# Patient Record
Sex: Female | Born: 1965 | Race: White | Hispanic: No | Marital: Single | Smoking: Never smoker
Health system: Southern US, Community
[De-identification: ages and names within clinical notes are randomized; demographics above are authoritative.]

## PROBLEM LIST (undated history)

## (undated) DIAGNOSIS — E876 Hypokalemia: Secondary | ICD-10-CM

## (undated) DIAGNOSIS — I10 Essential (primary) hypertension: Secondary | ICD-10-CM

## (undated) DIAGNOSIS — K219 Gastro-esophageal reflux disease without esophagitis: Secondary | ICD-10-CM

## (undated) DIAGNOSIS — F32A Depression, unspecified: Secondary | ICD-10-CM

## (undated) DIAGNOSIS — F329 Major depressive disorder, single episode, unspecified: Secondary | ICD-10-CM

## (undated) DIAGNOSIS — M329 Systemic lupus erythematosus, unspecified: Secondary | ICD-10-CM

## (undated) HISTORY — PX: ABDOMINAL HYSTERECTOMY: SHX81

---

## 2014-09-30 ENCOUNTER — Emergency Department (HOSPITAL_BASED_OUTPATIENT_CLINIC_OR_DEPARTMENT_OTHER)
Admission: EM | Admit: 2014-09-30 | Discharge: 2014-10-01 | Disposition: A | Discharge status: Home / Self Care | Payer: Self-pay | Attending: Emergency Medicine | Admitting: Emergency Medicine

## 2014-09-30 ENCOUNTER — Encounter (HOSPITAL_BASED_OUTPATIENT_CLINIC_OR_DEPARTMENT_OTHER): Payer: Self-pay | Admitting: Emergency Medicine

## 2014-09-30 ENCOUNTER — Emergency Department (HOSPITAL_BASED_OUTPATIENT_CLINIC_OR_DEPARTMENT_OTHER): Payer: Self-pay

## 2014-09-30 DIAGNOSIS — J392 Other diseases of pharynx: Secondary | ICD-10-CM | POA: Insufficient documentation

## 2014-09-30 DIAGNOSIS — Z8659 Personal history of other mental and behavioral disorders: Secondary | ICD-10-CM | POA: Insufficient documentation

## 2014-09-30 DIAGNOSIS — R070 Pain in throat: Secondary | ICD-10-CM

## 2014-09-30 DIAGNOSIS — R4702 Dysphasia: Secondary | ICD-10-CM | POA: Insufficient documentation

## 2014-09-30 DIAGNOSIS — I10 Essential (primary) hypertension: Secondary | ICD-10-CM | POA: Insufficient documentation

## 2014-09-30 DIAGNOSIS — R499 Unspecified voice and resonance disorder: Secondary | ICD-10-CM | POA: Insufficient documentation

## 2014-09-30 DIAGNOSIS — K219 Gastro-esophageal reflux disease without esophagitis: Secondary | ICD-10-CM | POA: Insufficient documentation

## 2014-09-30 HISTORY — DX: Gastro-esophageal reflux disease without esophagitis: K21.9

## 2014-09-30 HISTORY — DX: Depression, unspecified: F32.A

## 2014-09-30 HISTORY — DX: Major depressive disorder, single episode, unspecified: F32.9

## 2014-09-30 HISTORY — DX: Essential (primary) hypertension: I10

## 2014-09-30 LAB — BASIC METABOLIC PANEL
ANION GAP: 8 (ref 5–15)
BUN: 16 mg/dL (ref 6–23)
CALCIUM: 9.5 mg/dL (ref 8.4–10.5)
CO2: 29 mmol/L (ref 19–32)
Chloride: 102 mmol/L (ref 96–112)
Creatinine, Ser: 0.7 mg/dL (ref 0.50–1.10)
GFR calc non Af Amer: >90 mL/min (ref >90)
GLUCOSE: 109 mg/dL — AB (ref 70–99)
POTASSIUM: 3.3 mmol/L — AB (ref 3.5–5.1)
SODIUM: 139 mmol/L (ref 135–145)

## 2014-09-30 LAB — CBC WITH DIFFERENTIAL/PLATELET
BASOS PCT: 0 % (ref 0–1)
Basophils Absolute: 0 10*3/uL (ref 0.0–0.1)
EOS PCT: 2 % (ref 0–5)
Eosinophils Absolute: 0.3 10*3/uL (ref 0.0–0.7)
HEMATOCRIT: 39.8 % (ref 36.0–46.0)
HEMOGLOBIN: 13.4 g/dL (ref 12.0–15.0)
LYMPHS ABS: 5.2 10*3/uL — AB (ref 0.7–4.0)
LYMPHS PCT: 33 % (ref 12–46)
MCH: 29.7 pg (ref 26.0–34.0)
MCHC: 33.7 g/dL (ref 30.0–36.0)
MCV: 88.2 fL (ref 78.0–100.0)
MONO ABS: 1.6 10*3/uL — AB (ref 0.1–1.0)
Monocytes Relative: 10 % (ref 3–12)
NEUTROS ABS: 8.7 10*3/uL — AB (ref 1.7–7.7)
Neutrophils Relative %: 55 % (ref 43–77)
Platelets: 346 10*3/uL (ref 150–400)
RBC: 4.51 MIL/uL (ref 3.87–5.11)
RDW: 13.2 % (ref 11.5–15.5)
WBC: 15.8 10*3/uL — AB (ref 4.0–10.5)

## 2014-09-30 MED ORDER — DIPHENHYDRAMINE HCL 25 MG PO CAPS
50.0000 mg | ORAL_CAPSULE | Freq: Once | ORAL | Status: AC
  Administered 2014-09-30: 50 mg via ORAL
  Filled 2014-09-30: qty 2

## 2014-09-30 NOTE — ED Notes (Signed)
Per pt feels like swelling or something in throat woke w it last sat,  Has been seen for same by pcp  But no improvement

## 2014-09-30 NOTE — ED Notes (Signed)
Patient states that in the middle of the week she felt like her throat was swelling up and she went to her doctor. She was placed on prednisone, and then sent home and told to stop her flexiril. Patient states that she still feel like her throat is closing up and "I have to create my own saliva"

## 2014-09-30 NOTE — ED Provider Notes (Signed)
CSN: [id number]     Arrival date & time 09/30/14  2149 History  This chart was scribed for Pricilla Loveless, MD by Evon Slack, ED Scribe. This patient was seen in room MH01/MH01 and the patient's care was started at 10:32 PM.      Chief Complaint  Patient presents with  . Oral Swelling   HPI HPI Comments: Brenda Kramer is a 49 y.o. female who presents to the Emergency Department complaining of oral swelling onset 5 days prior. Pt states she feels as if she has something in her throat that she cant get up. Pt states she has associated voice change that is improving. Pt states she feels as if she is having difficulty swallowing (due to discomfort). Pt states that she feels as of she has to swallow to create her own saliva. Pt denies pain but states that it is annoying. Pt states she noticed her symptoms after being prescribed flexeril for back spasms. Pt states she went to her PCP 3 days ago and was started on prednisone (taper) and told to stop taking the flexeril and her symptoms have not improved. Pt denies fever, sore throat, vomiting or rash.  Past Medical History  Diagnosis Date  . Hypertension   . Depression   . GERD (gastroesophageal reflux disease)    Past Surgical History  Procedure Laterality Date  . Abdominal hysterectomy     No family history on file. History  Substance Use Topics  . Smoking status: Never Smoker   . Smokeless tobacco: Not on file  . Alcohol Use: No   OB History    No data available      Review of Systems  Constitutional: Negative for fever and chills.  HENT: Positive for trouble swallowing (painful, but able to tolerate PO) and voice change. Negative for sore throat.   Respiratory: Negative for shortness of breath.   Gastrointestinal: Negative for vomiting.  Musculoskeletal: Negative for neck pain and neck stiffness.  Skin: Negative for rash.  All other systems reviewed and are negative.    Allergies  Review of patient's allergies indicates  no known allergies.  Home Medications   Prior to Admission medications   Medication Sig Start Date End Date Taking? Authorizing Provider  esomeprazole (NEXIUM) 40 MG capsule Take 40 mg by mouth daily at 12 noon.   Yes Historical Provider, MD   BP 171/106 mmHg  Pulse 117  Temp(Src) 98.5 F (36.9 C) (Oral)  Resp 20  Ht  (1.676 m)  Wt 200 lb (90.719 kg)  BMI 32.30 kg/m2  SpO2 96%   Physical Exam  Constitutional: She is oriented to person, place, and time. She appears well-developed and well-nourished. No distress.  HENT:  Head: Normocephalic and atraumatic.  Right Ear: External ear normal.  Left Ear: External ear normal.  Mouth/Throat: Uvula is midline. No trismus in the jaw. No uvula swelling. No oropharyngeal exudate, posterior oropharyngeal edema, posterior oropharyngeal erythema or tonsillar abscesses.  Eyes: Right eye exhibits no discharge. Left eye exhibits no discharge.  Neck: Normal range of motion. Neck supple. No rigidity. No tracheal deviation present.  Cardiovascular: Regular rhythm and normal heart sounds.  Tachycardia present.   Pulmonary/Chest: Effort normal. No stridor. No respiratory distress. She has no wheezes.  Abdominal: She exhibits no distension.  Musculoskeletal: Normal range of motion.  Neurological: She is alert and oriented to person, place, and time.  Skin: Skin is warm and dry. She is not diaphoretic.  Psychiatric: She has a normal mood and  affect. Her behavior is normal.  Nursing note and vitals reviewed.   ED Course  Procedures (including critical care time) DIAGNOSTIC STUDIES: Oxygen Saturation is 96% on RA, adequate by my interpretation.    COORDINATION OF CARE: 10:41 PM-Discussed treatment plan with pt at bedside and pt agreed to plan.     Labs Review Labs Reviewed  BASIC METABOLIC PANEL - Abnormal; Notable for the following:    Potassium 3.3 (*)    Glucose, Bld 109 (*)    All other components within normal limits  CBC WITH  DIFFERENTIAL/PLATELET - Abnormal; Notable for the following:    WBC 15.8 (*)    Neutro Abs 8.7 (*)    Lymphs Abs 5.2 (*)    Monocytes Absolute 1.6 (*)    All other components within normal limits    Imaging Review Dg Neck Soft Tissue  09/30/2014   CLINICAL DATA:  Difficulty swallowing. Neck pain, hoarseness. Question foreign body.  EXAM: NECK SOFT TISSUES - 1+ VIEW  COMPARISON:  None.  FINDINGS: There is no evidence of retropharyngeal soft tissue swelling or epiglottic enlargement. The cervical airway is unremarkable and no radio-opaque foreign body identified.  IMPRESSION: Negative.   Electronically Signed   By: Charlett NoseKevin  Dover M.D.   On: 09/30/2014 23:33     EKG Interpretation None      MDM   Final diagnoses:  Throat discomfort   Patient appears well here, her initial heart rate seemed to be mostly anxiety that has improved. Patient has a soft neck with no rigidity. No drooling, fever, or other symptoms to be concerned about deep space infection. Soft tissue x-ray is unremarkable. She is able to tolerate fluids here in the ER. This could be possibly allergic reaction but has been going on from almost 1 week. She is on a blood pressure medicine but she's not know what it is. I discussed with her that if his lisinopril she should stop and ask her PCP to change her as this could be related. I do not know what is causing her irritation but there appears to be no acute infection or airway emergency. Stable for discharge home.   I personally performed the services described in this documentation, which was scribed in my presence. The recorded information has been reviewed and is accurate.      Pricilla LovelessScott Saoirse Legere, MD 10/01/14 0002

## 2014-09-30 NOTE — Discharge Instructions (Signed)
Check your blood pressure medicine when he get home. If you are on a medicine called LISINOPRIL, stop it immediately and call your Doctor to get your medicine switched.

## 2014-11-04 ENCOUNTER — Emergency Department (HOSPITAL_BASED_OUTPATIENT_CLINIC_OR_DEPARTMENT_OTHER)
Admission: EM | Admit: 2014-11-04 | Discharge: 2014-11-04 | Discharge status: Against Medical Advice | Payer: Self-pay | Attending: Emergency Medicine | Admitting: Emergency Medicine

## 2014-11-04 ENCOUNTER — Emergency Department (HOSPITAL_BASED_OUTPATIENT_CLINIC_OR_DEPARTMENT_OTHER): Payer: Self-pay

## 2014-11-04 ENCOUNTER — Encounter (HOSPITAL_BASED_OUTPATIENT_CLINIC_OR_DEPARTMENT_OTHER): Payer: Self-pay

## 2014-11-04 DIAGNOSIS — M791 Myalgia, unspecified site: Secondary | ICD-10-CM

## 2014-11-04 DIAGNOSIS — K219 Gastro-esophageal reflux disease without esophagitis: Secondary | ICD-10-CM | POA: Insufficient documentation

## 2014-11-04 DIAGNOSIS — E876 Hypokalemia: Secondary | ICD-10-CM | POA: Insufficient documentation

## 2014-11-04 DIAGNOSIS — Z79899 Other long term (current) drug therapy: Secondary | ICD-10-CM | POA: Insufficient documentation

## 2014-11-04 DIAGNOSIS — I10 Essential (primary) hypertension: Secondary | ICD-10-CM | POA: Insufficient documentation

## 2014-11-04 DIAGNOSIS — Z8659 Personal history of other mental and behavioral disorders: Secondary | ICD-10-CM | POA: Insufficient documentation

## 2014-11-04 HISTORY — DX: Hypokalemia: E87.6

## 2014-11-04 LAB — CK: Total CK: 87 U/L (ref 7–177)

## 2014-11-04 LAB — COMPREHENSIVE METABOLIC PANEL
ALT: 28 U/L (ref 0–35)
AST: 25 U/L (ref 0–37)
Albumin: 4.3 g/dL (ref 3.5–5.2)
Alkaline Phosphatase: 105 U/L (ref 39–117)
Anion gap: 5 (ref 5–15)
BUN: 9 mg/dL (ref 6–23)
CHLORIDE: 102 mmol/L (ref 96–112)
CO2: 28 mmol/L (ref 19–32)
Calcium: 9.5 mg/dL (ref 8.4–10.5)
Creatinine, Ser: 0.71 mg/dL (ref 0.50–1.10)
GFR calc non Af Amer: >90 mL/min (ref >90)
Glucose, Bld: 112 mg/dL — ABNORMAL HIGH (ref 70–99)
Potassium: 3.2 mmol/L — ABNORMAL LOW (ref 3.5–5.1)
Sodium: 135 mmol/L (ref 135–145)
TOTAL PROTEIN: 7.4 g/dL (ref 6.0–8.3)
Total Bilirubin: 0.5 mg/dL (ref 0.3–1.2)

## 2014-11-04 LAB — CBC WITH DIFFERENTIAL/PLATELET
BASOS ABS: 0 10*3/uL (ref 0.0–0.1)
Basophils Relative: 1 % (ref 0–1)
Eosinophils Absolute: 0.2 10*3/uL (ref 0.0–0.7)
Eosinophils Relative: 2 % (ref 0–5)
HCT: 41.1 % (ref 36.0–46.0)
Hemoglobin: 14.4 g/dL (ref 12.0–15.0)
Lymphocytes Relative: 30 % (ref 12–46)
Lymphs Abs: 2.3 10*3/uL (ref 0.7–4.0)
MCH: 29.9 pg (ref 26.0–34.0)
MCHC: 35 g/dL (ref 30.0–36.0)
MCV: 85.4 fL (ref 78.0–100.0)
MONO ABS: 0.5 10*3/uL (ref 0.1–1.0)
Monocytes Relative: 7 % (ref 3–12)
Neutro Abs: 4.6 10*3/uL (ref 1.7–7.7)
Neutrophils Relative %: 60 % (ref 43–77)
Platelets: 364 10*3/uL (ref 150–400)
RBC: 4.81 MIL/uL (ref 3.87–5.11)
RDW: 12.7 % (ref 11.5–15.5)
WBC: 7.7 10*3/uL (ref 4.0–10.5)

## 2014-11-04 LAB — MAGNESIUM: Magnesium: 2.1 mg/dL (ref 1.5–2.5)

## 2014-11-04 MED ORDER — POTASSIUM CHLORIDE CRYS ER 20 MEQ PO TBCR
40.0000 meq | EXTENDED_RELEASE_TABLET | Freq: Once | ORAL | Status: AC
  Administered 2014-11-04: 40 meq via ORAL
  Filled 2014-11-04: qty 2

## 2014-11-04 MED ORDER — POTASSIUM CHLORIDE 20 MEQ/15ML (10%) PO SOLN
40.0000 meq | Freq: Once | ORAL | Status: DC
  Filled 2014-11-04: qty 30

## 2014-11-04 NOTE — ED Notes (Signed)
In room to update patient, pt not in room, pt belongings not present, called patient, pt states she removed her IV herself and placed in the cannister on the wall (sharps box) - iv saline lock tubing visible in the sharps container - pt states she was tired of waiting. Informed patient that she did not wait for d/c instructions, pt states she was aware of that but did not want to wait. Apologized for her perception of ED delay.

## 2014-11-04 NOTE — ED Provider Notes (Signed)
CSN: [id number]     Arrival date & time 11/04/14  1624 History   First MD Initiated Contact with Patient 11/04/14 1635     Chief Complaint  Patient presents with  . Leg Swelling     (Consider location/radiation/quality/duration/timing/severity/associated sxs/prior Treatment) HPI [name] is a 49 y.o. female with history of hypertension and hypokalemia, presents to emergency department complaining of generalized myalgias, weakness, right leg pain. Patient states her symptoms have been going on for 5 days. She states she has history of the same with diagnosis of rhabdomyolysis. She states that she has bilateral leg swelling as well. She states she has pain in her right calf. Denies any injuries. Denies any strenuous activities. No recent travel or surgeries. No fever. Pt went to see her PCP today, was sent here.   Past Medical History  Diagnosis Date  . Hypertension   . Depression   . GERD (gastroesophageal reflux disease)   . Hypokalemia    Past Surgical History  Procedure Laterality Date  . Abdominal hysterectomy     No family history on file. History  Substance Use Topics  . Smoking status: Never Smoker   . Smokeless tobacco: Not on file  . Alcohol Use: No   OB History    No data available     Review of Systems  Constitutional: Positive for fatigue. Negative for fever and chills.  Respiratory: Negative for cough, chest tightness and shortness of breath.   Cardiovascular: Negative for chest pain, palpitations and leg swelling.  Gastrointestinal: Negative for nausea, vomiting, abdominal pain and diarrhea.  Genitourinary: Negative for dysuria and flank pain.  Musculoskeletal: Positive for myalgias and arthralgias. Negative for neck pain and neck stiffness.  Skin: Negative for rash.  Neurological: Negative for dizziness, weakness, numbness and headaches.  All other systems reviewed and are negative.     Allergies  Flexeril  Home Medications   Prior to Admission  medications   Medication Sig Start Date End Date Taking? Authorizing Provider  esomeprazole (NEXIUM) 40 MG capsule Take 40 mg by mouth daily at 12 noon.    Historical Provider, MD   BP 149/96 mmHg  Pulse 102  Temp(Src) 98.3 F (36.8 C) (Oral)  Resp 20  Ht 5\' 6"  (1.676 m)  Wt 221 lb (100.245 kg)  BMI 35.69 kg/m2  SpO2 100% Physical Exam  Constitutional: She appears well-developed and well-nourished. No distress.  HENT:  Head: Normocephalic.  Eyes: Conjunctivae are normal.  Neck: Neck supple.  Cardiovascular: Normal rate, regular rhythm and normal heart sounds.   Pulmonary/Chest: Effort normal and breath sounds normal. No respiratory distress. She has no wheezes. She has no rales.  Abdominal: Soft. Bowel sounds are normal. She exhibits no distension. There is no tenderness. There is no rebound.  Musculoskeletal: She exhibits no edema.  No obvious edema on my exam. TTP to right calf. Negative homans sign. No erythema, not warm to the touch, no skin rashes over bilateral legs. Moves all extremities without difficulties  Neurological: She is alert.  Skin: Skin is warm and dry.  Psychiatric: She has a normal mood and affect. Her behavior is normal.  Nursing note and vitals reviewed.   ED Course  Procedures (including critical care time) Labs Review Labs Reviewed  COMPREHENSIVE METABOLIC PANEL - Abnormal; Notable for the following:    Potassium 3.2 (*)    Glucose, Bld 112 (*)    All other components within normal limits  CBC WITH DIFFERENTIAL/PLATELET  MAGNESIUM  CK  Imaging Review No results found.   EKG Interpretation None      MDM   Final diagnoses:  Myalgia  Hypokalemia    patient with generalized muscle aches, subjective lower leg swelling bilaterally, right calf and right leg pain. History of rhabdomyolysis, will get labs including CK level. Will get ultrasound of the right leg to rule out DVT. She is afebrile, doubt infection at this time.  Labs all  negative except for slightly low potassium of 3.2. 40 mEq given by mouth in the emergency department. Ultrasound pending.  Patient walked out [facility] without informing staff after her ultrasound was done but not yet read.  Jaynie Crumble, PA-C 11/04/14 2220  Arby Barrette, MD 11/05/14 [phone]

## 2014-11-04 NOTE — ED Notes (Signed)
Patient transported to Ultrasound 

## 2014-11-04 NOTE — ED Notes (Addendum)
Pt reports having bodyaches and leg swelling x 5 days.  Reports saw PCP today and sent here to have warm area on right lower leg checked plus leg swelling.  Pt reports she has issues with K+ and rhabdo and that is suppose to be checked here too.

## 2015-07-04 IMAGING — US US EXTREM LOW VENOUS*R*
1 series · 13 of 24 positions shown · non-contrast
Comparison: None.

CLINICAL DATA: Right calf region pain and swelling for 5 days

EXAM:
RIGHT LOWER EXTREMITY VENOUS DUPLEX ULTRASOUND
TECHNIQUE: Gray-scale sonography with graded compression, as well as color
Doppler and duplex ultrasound were performed to evaluate the right
lower extremity deep venous system from the level of the common
femoral vein and including the common femoral, femoral, profunda
femoral, popliteal and calf veins including the posterior tibial,
peroneal and gastrocnemius veins when visible. The superficial great
saphenous vein was also interrogated. Spectral Doppler was utilized
to evaluate flow at rest and with distal augmentation maneuvers in
the common femoral, femoral and popliteal veins.

[Series 1: us extrem low venous*right* · 0.08mm/px · 13 of 38 slices shown]
[im 1/38]
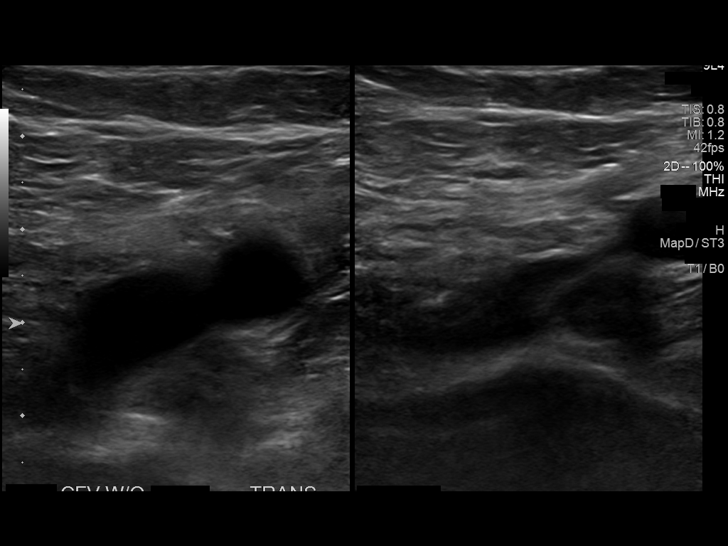
[im 4/38]
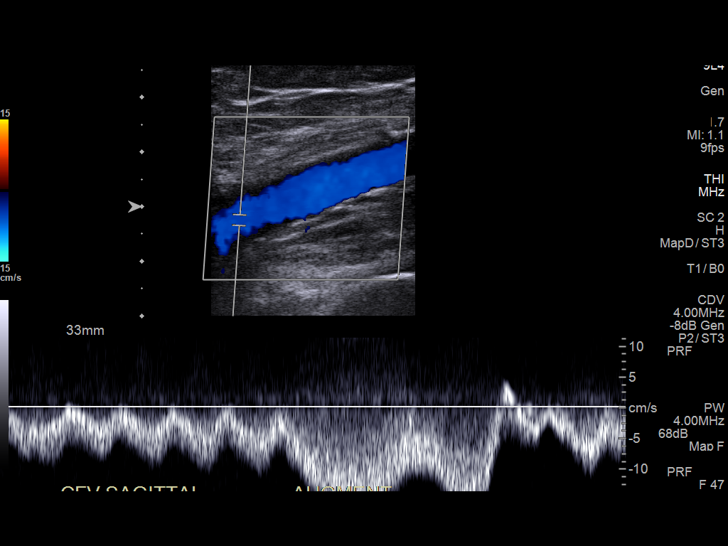
[im 7/38]
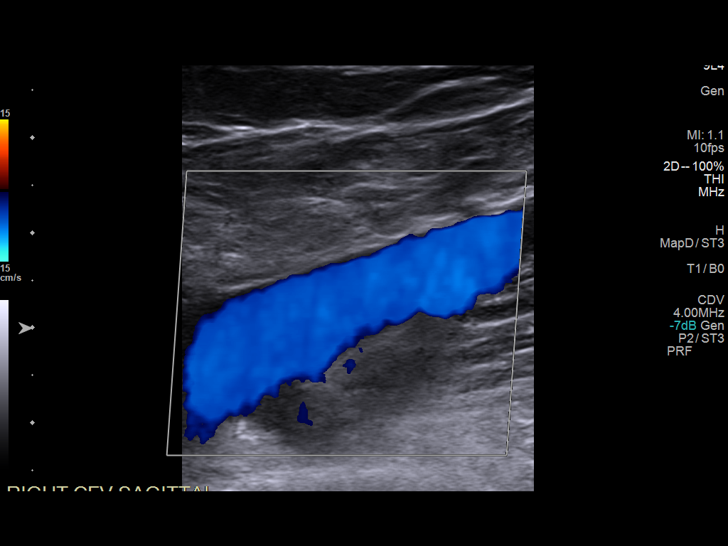
[im 10/38]
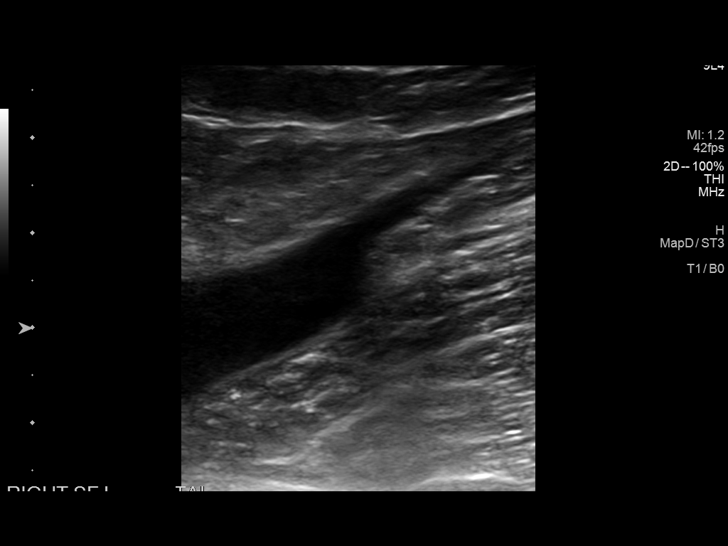
[im 13/38]
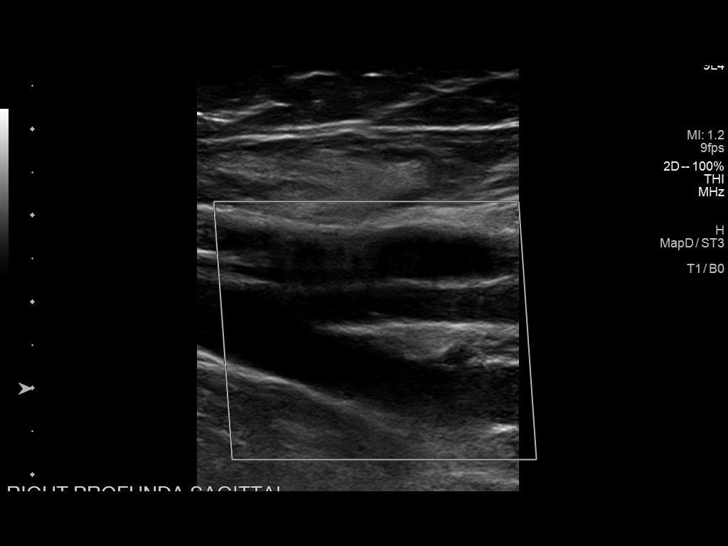
[im 17/38]
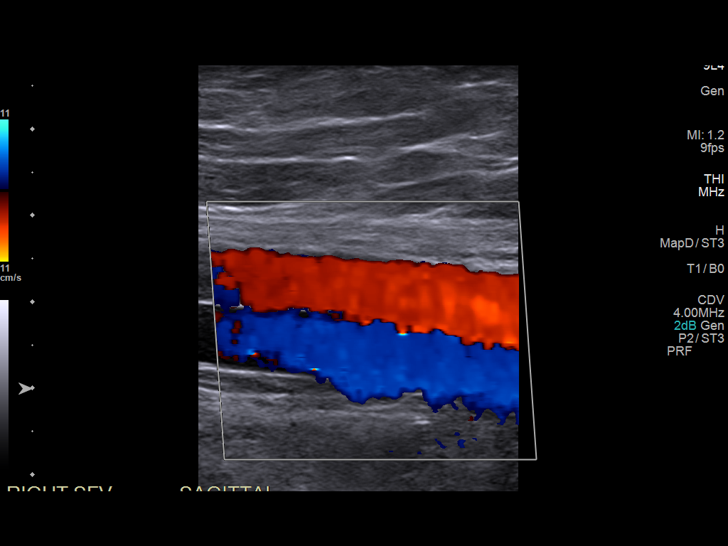
[im 20/38]
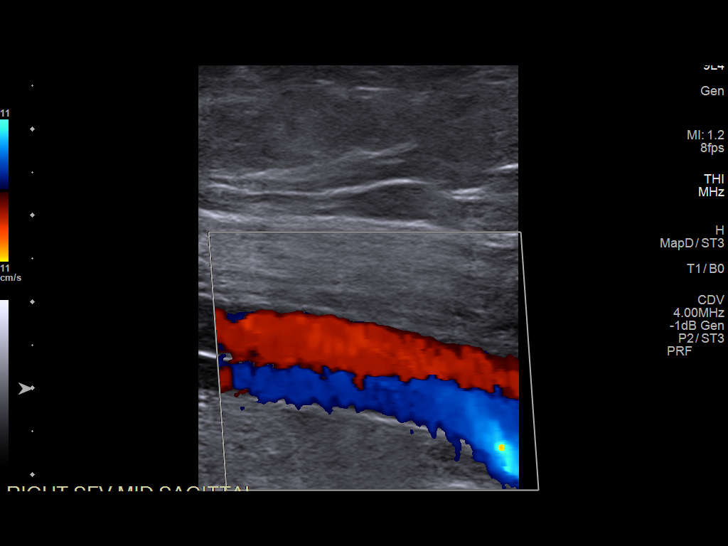
[im 21/38]
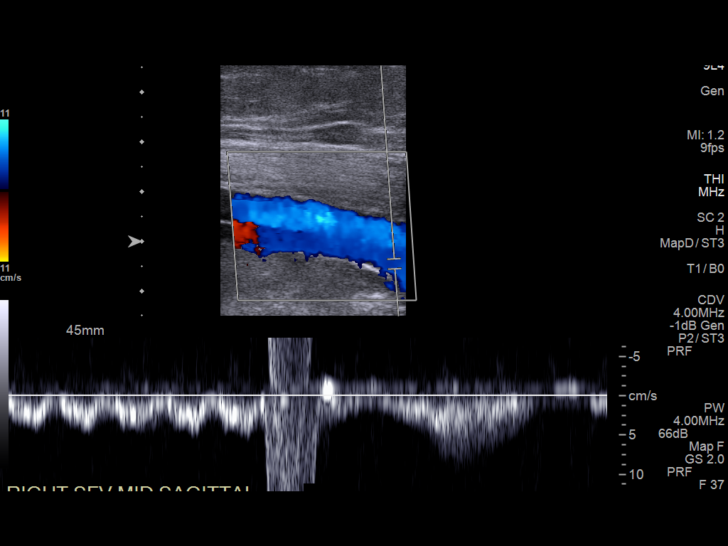
[im 25/38]
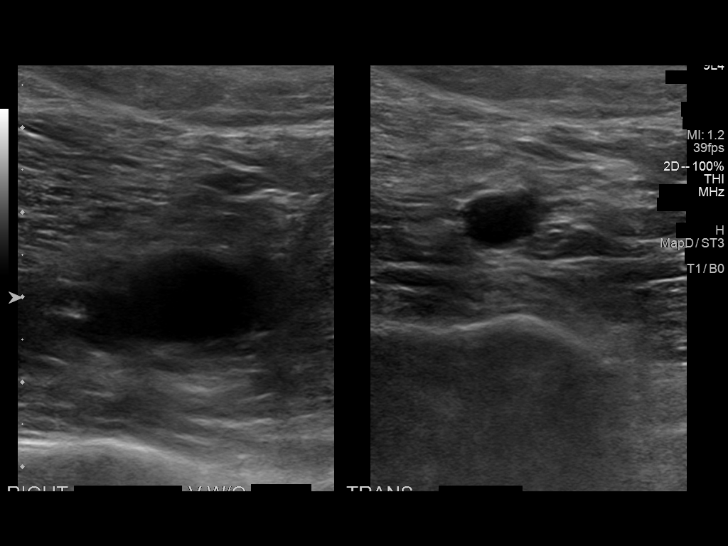
[im 28/38]
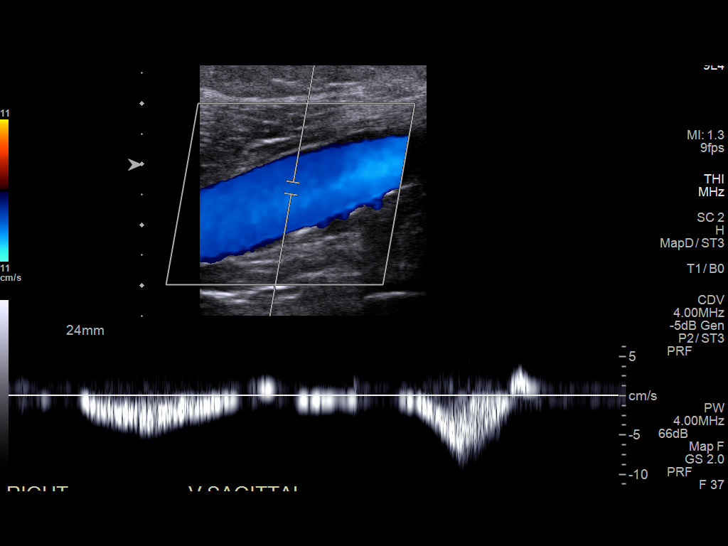
[im 31/38]
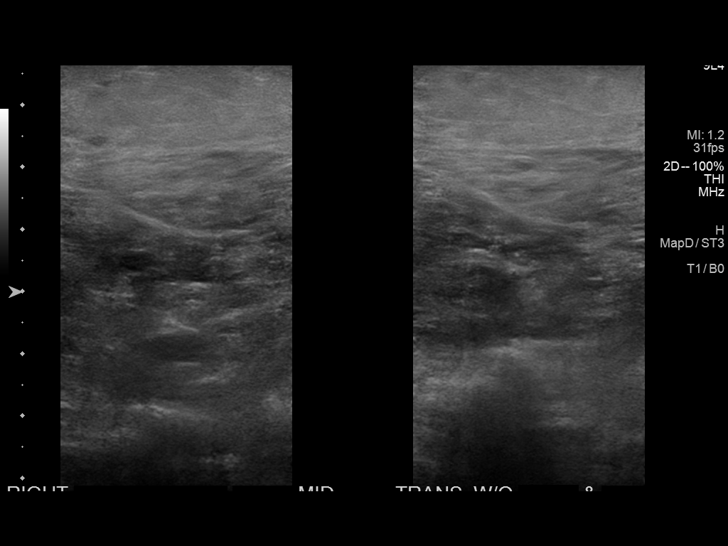
[im 34/38]
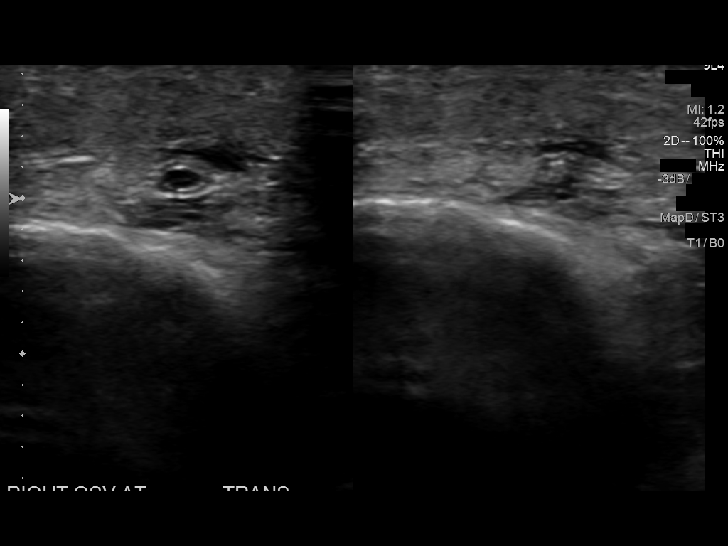
[im 38/38]
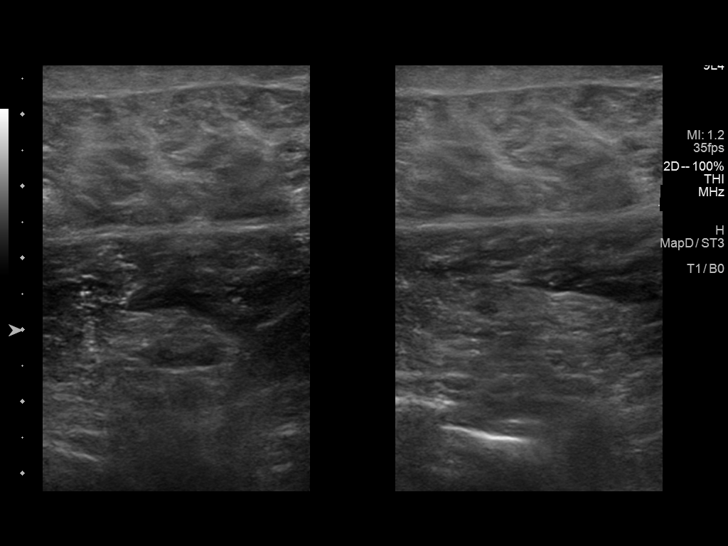

[13 of 24 positions shown; findings below may reference images not displayed]

FINDINGS: Contralateral Common Femoral Vein: Respiratory phasicity is normal
and symmetric with the symptomatic side. No evidence of thrombus.
Normal compressibility.

Common Femoral Vein: No evidence of thrombus. Normal
compressibility, respiratory phasicity and response to augmentation.

Saphenofemoral Junction: No evidence of thrombus. Normal
compressibility and flow on color Doppler imaging.

Profunda Femoral Vein: No evidence of thrombus. Normal
compressibility and flow on color Doppler imaging.

Femoral Vein: No evidence of thrombus. Normal compressibility,
respiratory phasicity and response to augmentation.

Popliteal Vein: No evidence of thrombus. Normal compressibility,
respiratory phasicity and response to augmentation.

Calf Veins: No evidence of thrombus. Normal compressibility and flow
on color Doppler imaging.

Superficial Great Saphenous Vein: No evidence of thrombus. Normal
compressibility and flow on color Doppler imaging.

Venous Reflux:  None.

Other Findings:  None.
IMPRESSION: No evidence of right lower extremity deep venous thrombosis.

## 2015-12-09 ENCOUNTER — Encounter (HOSPITAL_BASED_OUTPATIENT_CLINIC_OR_DEPARTMENT_OTHER): Payer: Self-pay | Admitting: Emergency Medicine

## 2015-12-09 DIAGNOSIS — Z90710 Acquired absence of both cervix and uterus: Secondary | ICD-10-CM | POA: Insufficient documentation

## 2015-12-09 DIAGNOSIS — M545 Low back pain: Secondary | ICD-10-CM | POA: Insufficient documentation

## 2015-12-09 DIAGNOSIS — K625 Hemorrhage of anus and rectum: Secondary | ICD-10-CM | POA: Insufficient documentation

## 2015-12-09 DIAGNOSIS — F329 Major depressive disorder, single episode, unspecified: Secondary | ICD-10-CM | POA: Insufficient documentation

## 2015-12-09 DIAGNOSIS — I10 Essential (primary) hypertension: Secondary | ICD-10-CM | POA: Insufficient documentation

## 2015-12-09 DIAGNOSIS — R195 Other fecal abnormalities: Secondary | ICD-10-CM | POA: Insufficient documentation

## 2015-12-09 DIAGNOSIS — Z79899 Other long term (current) drug therapy: Secondary | ICD-10-CM | POA: Insufficient documentation

## 2015-12-09 NOTE — ED Notes (Signed)
Patient reports that she has had "blood" inher stool for about 2 weeks. The patient denies any Dizziness or Lightheadedness. Pictures seen and stool looks pink in nature .

## 2015-12-10 ENCOUNTER — Emergency Department (HOSPITAL_BASED_OUTPATIENT_CLINIC_OR_DEPARTMENT_OTHER): Payer: Self-pay

## 2015-12-10 ENCOUNTER — Emergency Department (HOSPITAL_BASED_OUTPATIENT_CLINIC_OR_DEPARTMENT_OTHER)
Admission: EM | Admit: 2015-12-10 | Discharge: 2015-12-10 | Disposition: A | Discharge status: Home / Self Care | Payer: Self-pay | Attending: Emergency Medicine | Admitting: Emergency Medicine

## 2015-12-10 ENCOUNTER — Encounter (HOSPITAL_BASED_OUTPATIENT_CLINIC_OR_DEPARTMENT_OTHER): Payer: Self-pay | Admitting: Emergency Medicine

## 2015-12-10 DIAGNOSIS — K625 Hemorrhage of anus and rectum: Secondary | ICD-10-CM

## 2015-12-10 HISTORY — DX: Systemic lupus erythematosus, unspecified: M32.9

## 2015-12-10 LAB — COMPREHENSIVE METABOLIC PANEL
ALBUMIN: 4.2 g/dL (ref 3.5–5.0)
ALT: 31 U/L (ref 14–54)
AST: 25 U/L (ref 15–41)
Alkaline Phosphatase: 102 U/L (ref 38–126)
Anion gap: 7 (ref 5–15)
BUN: 15 mg/dL (ref 6–20)
CHLORIDE: 106 mmol/L (ref 101–111)
CO2: 25 mmol/L (ref 22–32)
Calcium: 9.8 mg/dL (ref 8.9–10.3)
Creatinine, Ser: 0.84 mg/dL (ref 0.44–1.00)
GFR calc Af Amer: >60 mL/min (ref >60)
GFR calc non Af Amer: >60 mL/min (ref >60)
Glucose, Bld: 120 mg/dL — ABNORMAL HIGH (ref 65–99)
POTASSIUM: 3.1 mmol/L — AB (ref 3.5–5.1)
Sodium: 138 mmol/L (ref 135–145)
Total Bilirubin: 1 mg/dL (ref 0.3–1.2)
Total Protein: 7.4 g/dL (ref 6.5–8.1)

## 2015-12-10 LAB — CBC WITH DIFFERENTIAL/PLATELET
BASOS ABS: 0.1 10*3/uL (ref 0.0–0.1)
Basophils Relative: 1 %
EOS PCT: 3 %
Eosinophils Absolute: 0.4 10*3/uL (ref 0.0–0.7)
HCT: 40.7 % (ref 36.0–46.0)
Hemoglobin: 14 g/dL (ref 12.0–15.0)
Lymphocytes Relative: 28 %
Lymphs Abs: 3.4 10*3/uL (ref 0.7–4.0)
MCH: 29.7 pg (ref 26.0–34.0)
MCHC: 34.4 g/dL (ref 30.0–36.0)
MCV: 86.4 fL (ref 78.0–100.0)
MONO ABS: 1 10*3/uL (ref 0.1–1.0)
Monocytes Relative: 8 %
NEUTROS ABS: 7.4 10*3/uL (ref 1.7–7.7)
Neutrophils Relative %: 60 %
PLATELETS: 355 10*3/uL (ref 150–400)
RBC: 4.71 MIL/uL (ref 3.87–5.11)
RDW: 13.9 % (ref 11.5–15.5)
WBC: 12.2 10*3/uL — AB (ref 4.0–10.5)

## 2015-12-10 LAB — OCCULT BLOOD X 1 CARD TO LAB, STOOL: FECAL OCCULT BLD: POSITIVE — AB

## 2015-12-10 MED ORDER — IOPAMIDOL (ISOVUE-300) INJECTION 61%
100.0000 mL | Freq: Once | INTRAVENOUS | Status: AC | PRN
  Administered 2015-12-10: 100 mL via INTRAVENOUS

## 2015-12-10 MED ORDER — SODIUM CHLORIDE 0.9 % IV BOLUS (SEPSIS)
1000.0000 mL | Freq: Once | INTRAVENOUS | Status: AC
Start: 2015-12-10 — End: 2015-12-10
  Administered 2015-12-10: 1000 mL via INTRAVENOUS

## 2015-12-10 NOTE — ED Provider Notes (Signed)
CSN: [id number]     Arrival date & time 12/09/15  2302 History   By signing my name below, I, Brenda Kramer, attest that this documentation has been prepared under the direction and in the presence of [name], MD.  Electronically Signed: Hollace HaywardAndrew Kramer, ED Scribe. 12/10/2015. 1:02 AM.     Chief Complaint  Patient presents with  . Rectal Bleeding   Patient is a 50 y.o. female presenting with hematochezia. The history is provided by the patient. No language interpreter was used.  Rectal Bleeding Quality:  Mucoid (pink) Amount:  Scant Duration: one episode today and one episode several weeks ago.  Both following ingesting pecans. Timing:  Rare Progression:  Unchanged Chronicity:  New Context: not diarrhea and not rectal injury   Worsened by:  Nothing tried Ineffective treatments:  None tried Associated symptoms: no abdominal pain, no dizziness, no light-headedness, no loss of consciousness and no vomiting   Risk factors: no NSAID use    HPI Comments: Brenda Kramer is a 50 y.o. female with a PMHx of HTN, GERD, Hypokalemia, and Lupus who presents to the Emergency Department complaining of intermittent hematchezia that began several hours ago. Pt has associated lower back pain and bowel incontinence. Stool is noted as a pink color, and her bowel movements have been soft and irregular. Pt denies eating foods that would have colored her stool, such as beets, strawberries, or rhubarb. No alleviating factors noted at this time. Pt denies light-headedness or dizziness.  She states both this and previous episode coincide with eating nuts several days earlier  Past Medical History  Diagnosis Date  . Hypertension   . Depression   . GERD (gastroesophageal reflux disease)   . Hypokalemia   . Lupus [facility](HCC)    Past Surgical History  Procedure Laterality Date  . Abdominal hysterectomy     History reviewed. No pertinent family history. Social History  Substance Use Topics  . Smoking status: Never  Smoker   . Smokeless tobacco: None  . Alcohol Use: No   OB History    No data available     Review of Systems  Gastrointestinal: Positive for blood in stool and hematochezia. Negative for nausea, vomiting, abdominal pain, diarrhea and constipation.  Neurological: Negative for dizziness, loss of consciousness and light-headedness.  All other systems reviewed and are negative.  Allergies  Flexeril  Home Medications   Prior to Admission medications   Medication Sig Start Date End Date Taking? Authorizing Provider  amLODipine (NORVASC) 10 MG tablet Take 10 mg by mouth daily.   Yes Historical Provider, MD  baclofen (LIORESAL) 10 MG tablet Take 10 mg by mouth 3 (three) times daily.   Yes Historical Provider, MD  FLUoxetine (PROZAC) 20 MG capsule Take 20 mg by mouth daily.   Yes Historical Provider, MD  LORazepam (ATIVAN) 1 MG tablet Take 1 mg by mouth every 8 (eight) hours.   Yes Historical Provider, MD  nabumetone (RELAFEN) 500 MG tablet Take 500 mg by mouth daily.   Yes Historical Provider, MD  potassium chloride SA (K-DUR,KLOR-CON) 20 MEQ tablet Take 20 mEq by mouth 2 (two) times daily.   Yes Historical Provider, MD  traZODone (DESYREL) 150 MG tablet Take 150 mg by mouth at bedtime.   Yes Historical Provider, MD  esomeprazole (NEXIUM) 40 MG capsule Take 40 mg by mouth daily at 12 noon.    Historical Provider, MD   Pulse 101  Temp(Src) 97.8 F (36.6 C) (Oral)  Resp 18  Ht 5'  2" (1.575 m)  Wt 210 lb (95.255 kg)  BMI 38.40 kg/m2  SpO2 100%   Physical Exam  Constitutional: She is oriented to person, place, and time. She appears well-developed and well-nourished.  HENT:  Head: Normocephalic and atraumatic.  Mouth/Throat: Oropharynx is clear and moist. No oropharyngeal exudate.  Eyes: Conjunctivae and EOM are normal. Pupils are equal, round, and reactive to light.  Neck: Normal range of motion. Neck supple. No JVD present. No tracheal deviation present.  Cardiovascular: Normal  rate, regular rhythm, normal heart sounds and intact distal pulses.  Exam reveals no gallop and no friction rub.   No murmur heard. RRR.   Pulmonary/Chest: Effort normal and breath sounds normal. No stridor. No respiratory distress. She has no wheezes. She has no rales.  Lungs CTA bilaterally.   Abdominal: Soft. She exhibits no distension. There is no tenderness. There is no rebound and no guarding.  Hyperactive bowel sounds throughout.   Genitourinary: Guaiac positive stool.  Large nut fragments (both in quantity and size) nearly undigested with some shells in rectal vault with pink mucus, no stool.    Musculoskeletal: Normal range of motion.  No stepoffs, or crepit  Lymphadenopathy:    She has no cervical adenopathy.  Neurological: She is alert and oriented to person, place, and time. She has normal reflexes.  Skin: Skin is warm and dry.  Psychiatric: She has a normal mood and affect.  Nursing note and vitals reviewed.  Chaperone present throughout entire exam.   ED Course  Procedures (including critical care time)  DIAGNOSTIC STUDIES: Oxygen Saturation is 100% on RA, normal by my interpretation.   COORDINATION OF CARE: 1:02 AM-Discussed next steps with pt including CBC, CMP, and rectal examination. Pt verbalized understanding and is agreeable with the plan.   Labs Review Labs Reviewed  CBC WITH DIFFERENTIAL/PLATELET - Abnormal; Notable for the following:    WBC 12.2 (*)    All other components within normal limits  COMPREHENSIVE METABOLIC PANEL - Abnormal; Notable for the following:    Potassium 3.1 (*)    Glucose, Bld 120 (*)    All other components within normal limits  OCCULT BLOOD X 1 CARD TO LAB, STOOL    Imaging Review No results found. I have personally reviewed and evaluated these images and lab results as part of my medical decision-making.   EKG Interpretation None      MDM   Final diagnoses:  None    Filed Vitals:   12/10/15 0249 12/10/15 0523   BP: 124/83 142/83  Pulse: 88 78  Temp:  97.7 F (36.5 C)  Resp: 20 16    Results for orders placed or performed during the hospital encounter of 12/10/15  Occult blood card to lab, stool Provider will collect  Result Value Ref Range   Fecal Occult Bld POSITIVE (A) NEGATIVE  CBC with Differential/Platelet  Result Value Ref Range   WBC 12.2 (H) 4.0 - 10.5 K/uL   RBC 4.71 3.87 - 5.11 MIL/uL   Hemoglobin 14.0 12.0 - 15.0 g/dL   HCT 40.9 81.1 - 91.4 %   MCV 86.4 78.0 - 100.0 fL   MCH 29.7 26.0 - 34.0 pg   MCHC 34.4 30.0 - 36.0 g/dL   RDW 78.2 95.6 - 21.3 %   Platelets 355 150 - 400 K/uL   Neutrophils Relative % 60 %   Neutro Abs 7.4 1.7 - 7.7 K/uL   Lymphocytes Relative 28 %   Lymphs Abs 3.4 0.7 - 4.0  K/uL   Monocytes Relative 8 %   Monocytes Absolute 1.0 0.1 - 1.0 K/uL   Eosinophils Relative 3 %   Eosinophils Absolute 0.4 0.0 - 0.7 K/uL   Basophils Relative 1 %   Basophils Absolute 0.1 0.0 - 0.1 K/uL  Comprehensive metabolic panel  Result Value Ref Range   Sodium 138 135 - 145 mmol/L   Potassium 3.1 (L) 3.5 - 5.1 mmol/L   Chloride 106 101 - 111 mmol/L   CO2 25 22 - 32 mmol/L   Glucose, Bld 120 (H) 65 - 99 mg/dL   BUN 15 6 - 20 mg/dL   Creatinine, Ser 6.62 0.44 - 1.00 mg/dL   Calcium 9.8 8.9 - 94.7 mg/dL   Total Protein 7.4 6.5 - 8.1 g/dL   Albumin 4.2 3.5 - 5.0 g/dL   AST 25 15 - 41 U/L   ALT 31 14 - 54 U/L   Alkaline Phosphatase 102 38 - 126 U/L   Total Bilirubin 1.0 0.3 - 1.2 mg/dL   GFR calc non Af Amer >60 >60 mL/min   GFR calc Af Amer >60 >60 mL/min   Anion gap 7 5 - 15   Ct Abdomen Pelvis W Contrast  12/10/2015  CLINICAL DATA:  Intermittent rectal bleeding with hard particles in the vault. EXAM: CT ABDOMEN AND PELVIS WITH CONTRAST TECHNIQUE: Multidetector CT imaging of the abdomen and pelvis was performed using the standard protocol following bolus administration of intravenous contrast. CONTRAST:  ISOVUE-300 IOPAMIDOL (ISOVUE-300) INJECTION 61%  COMPARISON:  11/26/2006 FINDINGS: Lower chest and abdominal wall:  No contributory findings. Hepatobiliary: No focal liver abnormality.No evidence of biliary obstruction or stone. Pancreas: Unremarkable. Spleen: Unremarkable. Adrenals/Urinary Tract: Negative adrenals. No hydronephrosis or stone. Unremarkable bladder. Reproductive:Hysterectomy with negative adnexae. Pelvic floor laxity. Stomach/Bowel: No explanation for rectal bleeding. No visible mass, hemorrhoid, or diverticulosis. No abnormal stool retention or rectal impaction. Vascular/Lymphatic: No acute vascular abnormality. No mass or adenopathy. Peritoneal: No ascites or pneumoperitoneum. Musculoskeletal: No acute abnormalities. IMPRESSION: No explanation for rectal bleeding.  Stable exam compared to 2008. Electronically Signed   By: Marnee Spring M.D.   On: 12/10/2015 03:02     Vitals are benign and reassuring. Given the rectal exam I suspect there is an internal fissure from the large nut fragments with shell material. Patient is feeling well and has had no episodes in the ED.  Given the scant amount of blood she showed me (camera picture) and the pink mucus with normal CT and blood count this is reasonable.  I suggest follow up Monday with her PMD and closely with GI.  And soft mechanical diet free of seeds nuts and shells as I am concerned this may have caused fissuring as she states she is eating a lot of pecans.  Strict return precautions given.  Patient verbalizes understanding and agrees to follow up   I personally performed the services described in this documentation, which was scribed in my presence. The recorded information has been reviewed and is accurate.      Cy Blamer, MD 12/10/15 0630
